# Patient Record
Sex: Female | Born: 1995 | Hispanic: Yes | Marital: Single
Health system: Southern US, Community
[De-identification: ages and names within clinical notes are randomized; demographics above are authoritative.]

---
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
## 2013-04-05 IMAGING — CR DG CHEST 2V
3 series · 3 of 3 positions shown · non-contrast
Comparison: None.

CLINICAL DATA: Pain for nodule over the xiphoid process

CHEST - 2 VIEW

[view not recorded (1 of 3)]
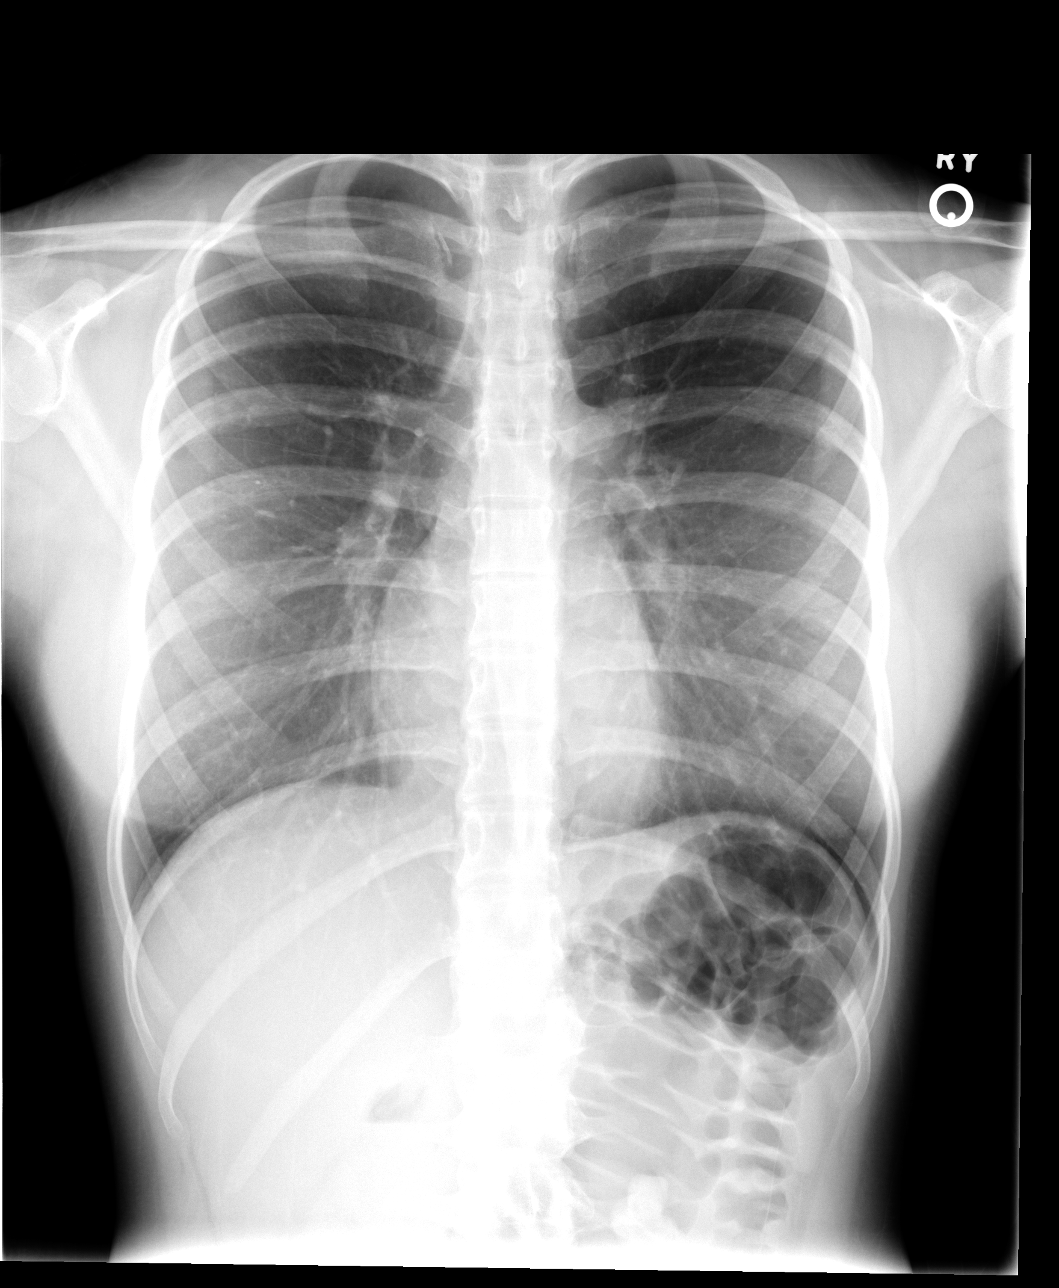

[view not recorded (2 of 3)]
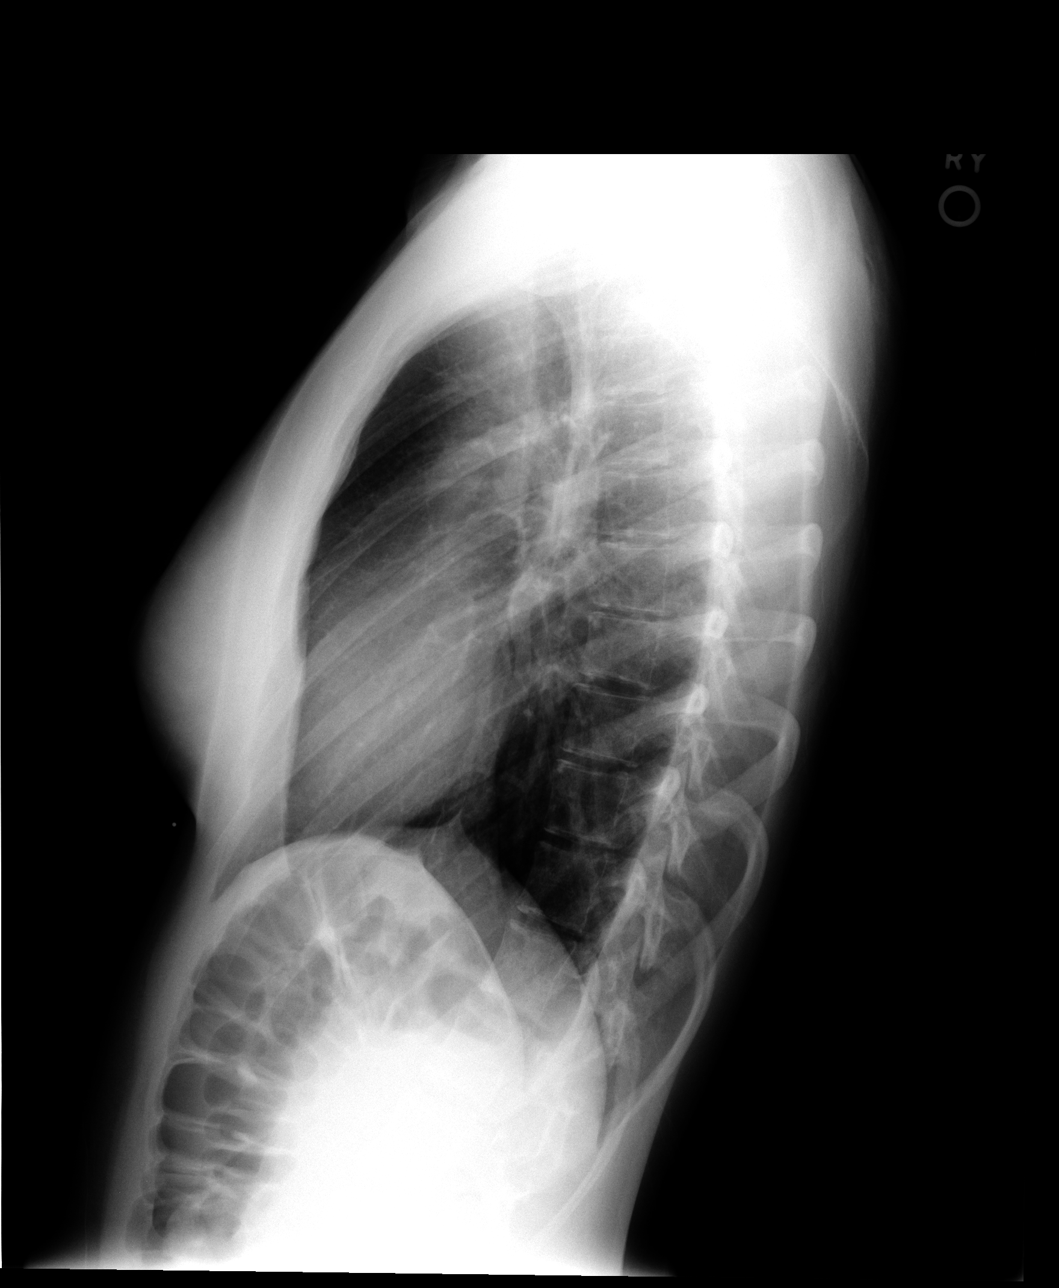

[view not recorded (3 of 3)]
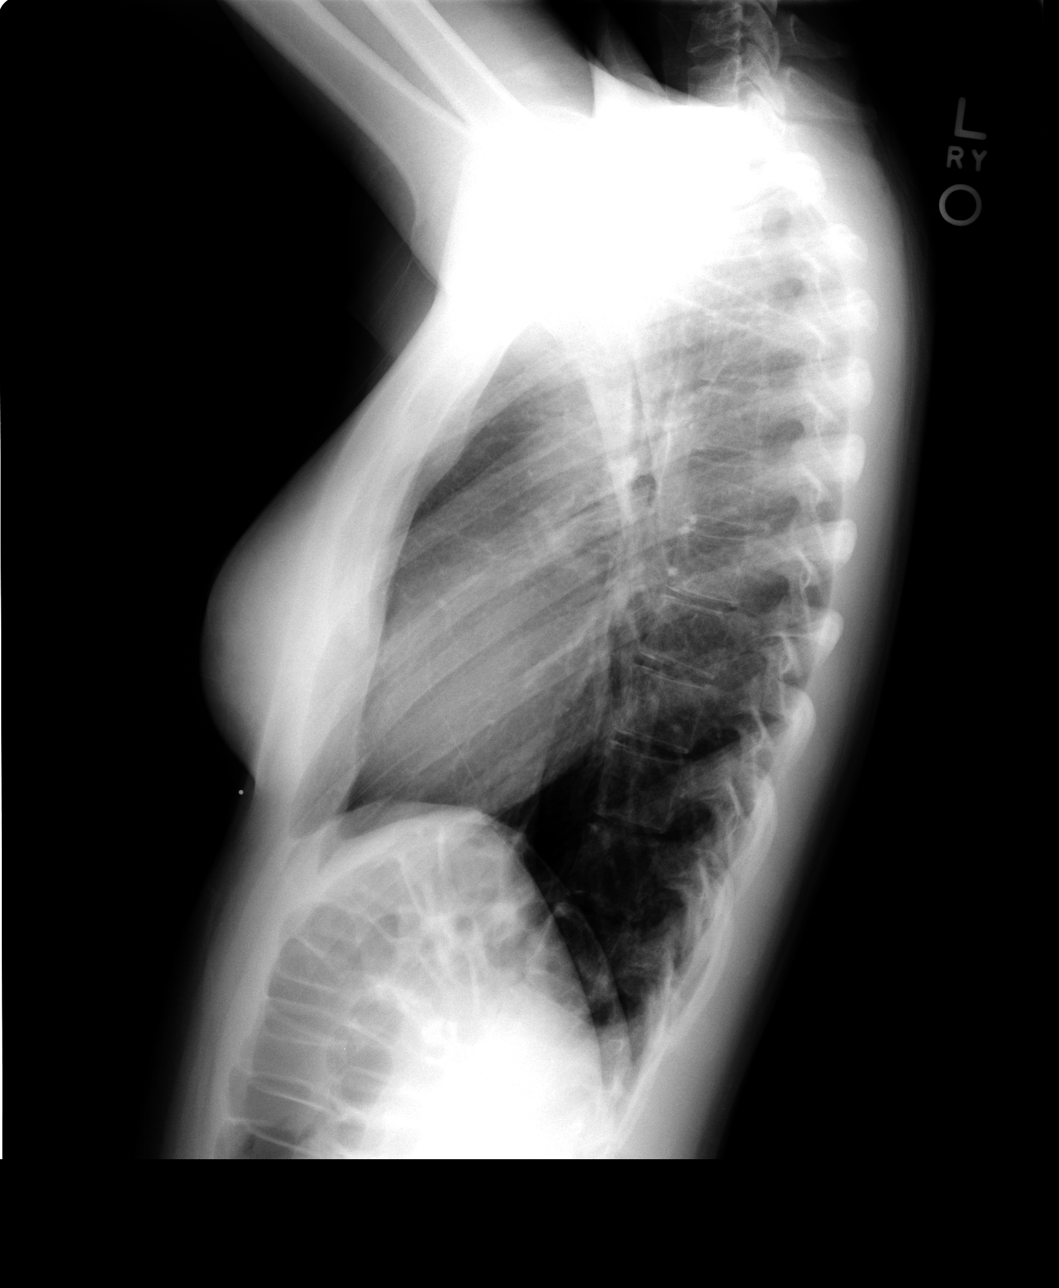

[3 of 3 positions shown; findings below may reference images not displayed]

FINDINGS: No active infiltrate or effusion is seen.  Mediastinal
contours appear normal.  The heart is within normal limits in size.
No bony abnormality is noted.  On the lateral view a small metallic
marker was placed over the area of tenderness over the xiphoid
process, and no underlying abnormality is seen.
IMPRESSION: No active lung disease.  No abnormality is noted on the lateral
view at the site in question.

## 2021-07-08 ENCOUNTER — Telehealth: Payer: Self-pay | Admitting: *Deleted

## 2021-08-12 NOTE — Telephone Encounter (Signed)
error 

## 2022-03-13 DIAGNOSIS — Z419 Encounter for procedure for purposes other than remedying health state, unspecified: Secondary | ICD-10-CM | POA: Diagnosis not present

## 2022-04-13 DIAGNOSIS — Z419 Encounter for procedure for purposes other than remedying health state, unspecified: Secondary | ICD-10-CM | POA: Diagnosis not present

## 2022-05-14 DIAGNOSIS — Z419 Encounter for procedure for purposes other than remedying health state, unspecified: Secondary | ICD-10-CM | POA: Diagnosis not present

## 2022-06-12 DIAGNOSIS — Z419 Encounter for procedure for purposes other than remedying health state, unspecified: Secondary | ICD-10-CM | POA: Diagnosis not present

## 2022-07-13 DIAGNOSIS — Z419 Encounter for procedure for purposes other than remedying health state, unspecified: Secondary | ICD-10-CM | POA: Diagnosis not present
